# Patient Record
Sex: Female | Born: 1980 | Race: Black or African American | Hispanic: No | Marital: Single | Smoking: Never smoker
Health system: Southern US, Community
[De-identification: ages and names within clinical notes are randomized; demographics above are authoritative.]

## PROBLEM LIST (undated history)

## (undated) DIAGNOSIS — G039 Meningitis, unspecified: Secondary | ICD-10-CM

## (undated) HISTORY — PX: APPENDECTOMY: SHX54

---

## 2016-11-10 ENCOUNTER — Emergency Department (HOSPITAL_COMMUNITY): Payer: Medicaid Other

## 2016-11-10 ENCOUNTER — Encounter (HOSPITAL_COMMUNITY): Payer: Self-pay | Admitting: Emergency Medicine

## 2016-11-10 ENCOUNTER — Emergency Department (HOSPITAL_COMMUNITY)
Admission: EM | Admit: 2016-11-10 | Discharge: 2016-11-10 | Disposition: A | Discharge status: Home / Self Care | Payer: Medicaid Other | Attending: Emergency Medicine | Admitting: Emergency Medicine

## 2016-11-10 DIAGNOSIS — E876 Hypokalemia: Secondary | ICD-10-CM | POA: Diagnosis not present

## 2016-11-10 DIAGNOSIS — R197 Diarrhea, unspecified: Secondary | ICD-10-CM | POA: Diagnosis not present

## 2016-11-10 DIAGNOSIS — R1013 Epigastric pain: Secondary | ICD-10-CM | POA: Diagnosis not present

## 2016-11-10 DIAGNOSIS — R112 Nausea with vomiting, unspecified: Secondary | ICD-10-CM | POA: Insufficient documentation

## 2016-11-10 HISTORY — DX: Meningitis, unspecified: G03.9

## 2016-11-10 LAB — URINALYSIS, ROUTINE W REFLEX MICROSCOPIC
Bacteria, UA: NONE SEEN
Bilirubin Urine: NEGATIVE
Glucose, UA: NEGATIVE mg/dL
Ketones, ur: 20 mg/dL — AB
Leukocytes, UA: NEGATIVE
Nitrite: NEGATIVE
Protein, ur: NEGATIVE mg/dL
Specific Gravity, Urine: 1.029 (ref 1.005–1.030)
Squamous Epithelial / LPF: NONE SEEN
pH: 5 (ref 5.0–8.0)

## 2016-11-10 LAB — CBC
HCT: 35.9 % — ABNORMAL LOW (ref 36.0–46.0)
Hemoglobin: 12.5 g/dL (ref 12.0–15.0)
MCH: 28.3 pg (ref 26.0–34.0)
MCHC: 34.8 g/dL (ref 30.0–36.0)
MCV: 81.2 fL (ref 78.0–100.0)
Platelets: 295 10*3/uL (ref 150–400)
RBC: 4.42 MIL/uL (ref 3.87–5.11)
RDW: 12.9 % (ref 11.5–15.5)
WBC: 12 10*3/uL — ABNORMAL HIGH (ref 4.0–10.5)

## 2016-11-10 LAB — COMPREHENSIVE METABOLIC PANEL
ALT: 13 U/L — ABNORMAL LOW (ref 14–54)
AST: 21 U/L (ref 15–41)
Albumin: 4.8 g/dL (ref 3.5–5.0)
Alkaline Phosphatase: 66 U/L (ref 38–126)
Anion gap: 7 (ref 5–15)
BUN: 12 mg/dL (ref 6–20)
CO2: 25 mmol/L (ref 22–32)
Calcium: 9.9 mg/dL (ref 8.9–10.3)
Chloride: 108 mmol/L (ref 101–111)
Creatinine, Ser: 0.76 mg/dL (ref 0.44–1.00)
GFR calc Af Amer: >60 mL/min (ref >60)
GFR calc non Af Amer: >60 mL/min (ref >60)
Glucose, Bld: 88 mg/dL (ref 65–99)
Potassium: 2.9 mmol/L — ABNORMAL LOW (ref 3.5–5.1)
Sodium: 140 mmol/L (ref 135–145)
Total Bilirubin: 1.2 mg/dL (ref 0.3–1.2)
Total Protein: 7.9 g/dL (ref 6.5–8.1)

## 2016-11-10 LAB — LIPASE, BLOOD: Lipase: 81 U/L — ABNORMAL HIGH (ref 11–51)

## 2016-11-10 LAB — PREGNANCY, URINE: Preg Test, Ur: NEGATIVE

## 2016-11-10 MED ORDER — GI COCKTAIL ~~LOC~~
30.0000 mL | Freq: Once | ORAL | Status: AC
  Administered 2016-11-10: 30 mL via ORAL
  Filled 2016-11-10: qty 30

## 2016-11-10 MED ORDER — OMEPRAZOLE 20 MG PO CPDR: 20.0000 mg | DELAYED_RELEASE_CAPSULE | Freq: Every day | ORAL | 0 refills | Status: AC

## 2016-11-10 MED ORDER — IOPAMIDOL (ISOVUE-300) INJECTION 61%
INTRAVENOUS | Status: AC
  Filled 2016-11-10: qty 100

## 2016-11-10 MED ORDER — MORPHINE SULFATE (PF) 4 MG/ML IV SOLN
4.0000 mg | Freq: Once | INTRAVENOUS | Status: AC
  Administered 2016-11-10: 4 mg via INTRAVENOUS
  Filled 2016-11-10: qty 1

## 2016-11-10 MED ORDER — POTASSIUM CHLORIDE CRYS ER 20 MEQ PO TBCR
40.0000 meq | EXTENDED_RELEASE_TABLET | Freq: Once | ORAL | Status: AC
  Administered 2016-11-10: 40 meq via ORAL
  Filled 2016-11-10: qty 2

## 2016-11-10 MED ORDER — IOPAMIDOL (ISOVUE-300) INJECTION 61%
100.0000 mL | Freq: Once | INTRAVENOUS | Status: AC | PRN
  Administered 2016-11-10: 100 mL via INTRAVENOUS

## 2016-11-10 MED ORDER — ONDANSETRON HCL 4 MG/2ML IJ SOLN
4.0000 mg | Freq: Once | INTRAMUSCULAR | Status: AC
  Administered 2016-11-10: 4 mg via INTRAVENOUS
  Filled 2016-11-10: qty 2

## 2016-11-10 MED ORDER — SUCRALFATE 1 GM/10ML PO SUSP: 1.0000 g | Freq: Three times a day (TID) | ORAL | 0 refills | Status: AC

## 2016-11-10 NOTE — ED Notes (Signed)
Ultrasound AT BEDSIDE 

## 2016-11-10 NOTE — ED Triage Notes (Signed)
Patient c/o N/V with abdominal discomfort x1 week. Denies chest pain and SOB. Ambulatory to triage.

## 2016-11-10 NOTE — ED Notes (Signed)
EDPA ROBERT Provider at bedside. 

## 2016-11-10 NOTE — ED Provider Notes (Signed)
[facility]-EMERGENCY DEPT Provider Note   CSN: [id number] Arrival date & time: 11/10/16  1513     History   Chief Complaint Chief Complaint  Patient presents with  . Emesis  . Nausea    HPI Sonya Carter is a 36 y.o. female.  Patient presents to the ED with a chief complaint of epigastric abdominal pain.  She also complains of associated nausea, vomiting, and a few episodes of diarrhea.  Onset was 1 week ago.  Symptoms are worsened when she sits up.  She denies any change with eating or drinking.  She denies using and alcohol.  She denies any fevers, chills, chest pain, SOB, cough, dysuria, or vaginal discharge.  Prior surgical hx includes appendectomy only.  There are no other associated symptoms or modifying factors.   The history is provided by the patient. No language interpreter was used.    Past Medical History:  Diagnosis Date  . Meningitis     There are no active problems to display for this patient.   Past Surgical History:  Procedure Laterality Date  . APPENDECTOMY      OB History    No data available       Home Medications    Prior to Admission medications   Medication Sig Start Date End Date Taking? Authorizing Provider  diphenhydrAMINE (BENADRYL) 25 MG tablet Take 25 mg by mouth every 6 (six) hours as needed for allergies.   Yes Historical Provider, MD    Family History History reviewed. No pertinent family history.  Social History Social History  Substance Use Topics  . Smoking status: Never Smoker  . Smokeless tobacco: Never Used  . Alcohol use Not on file     Allergies   Patient has no known allergies.   Review of Systems Review of Systems  Gastrointestinal: Positive for abdominal pain, diarrhea, nausea and vomiting.  All other systems reviewed and are negative.    Physical Exam Updated Vital Signs BP 130/88 (BP Location: Left Arm)   Pulse 79   Temp 98.5 F (36.9 C) (Oral)   Resp 16   Ht 5\' 2"  (1.575 m)   LMP 10/13/2016    SpO2 99%   Physical Exam  Constitutional: She is oriented to person, place, and time. She appears well-developed and well-nourished.  HENT:  Head: Normocephalic and atraumatic.  Eyes: Conjunctivae and EOM are normal. Pupils are equal, round, and reactive to light.  Neck: Normal range of motion. Neck supple.  Cardiovascular: Normal rate and regular rhythm.  Exam reveals no gallop and no friction rub.   No murmur heard. Pulmonary/Chest: Effort normal and breath sounds normal. No respiratory distress. She has no wheezes. She has no rales. She exhibits no tenderness.  Abdominal: Soft. Bowel sounds are normal. She exhibits no distension and no mass. There is tenderness. There is no rebound and no guarding.  Moderate epigastric tenderness No other focal abdominal tenderness  Musculoskeletal: Normal range of motion. She exhibits no edema or tenderness.  Neurological: She is alert and oriented to person, place, and time.  Skin: Skin is warm and dry.  Psychiatric: She has a normal mood and affect. Her behavior is normal. Judgment and thought content normal.  Nursing note and vitals reviewed.    ED Treatments / Results  Labs (all labs ordered are listed, but only abnormal results are displayed) Labs Reviewed  LIPASE, BLOOD - Abnormal; Notable for the following:       Result Value   Lipase 81 (*)  All other components within normal limits  COMPREHENSIVE METABOLIC PANEL - Abnormal; Notable for the following:    Potassium 2.9 (*)    ALT 13 (*)    All other components within normal limits  CBC - Abnormal; Notable for the following:    WBC 12.0 (*)    HCT 35.9 (*)    All other components within normal limits  URINALYSIS, ROUTINE W REFLEX MICROSCOPIC  POC URINE PREG, ED    EKG  EKG Interpretation None       Radiology No results found.  Procedures Procedures (including critical care time)  Medications Ordered in ED Medications  morphine 4 MG/ML injection 4 mg (not  administered)  ondansetron (ZOFRAN) injection 4 mg (not administered)  gi cocktail (Maalox,Lidocaine,Donnatal) (not administered)     Initial Impression / Assessment and Plan / ED Course  I have reviewed the triage vital signs and the nursing notes.  Pertinent labs & imaging results that were available during my care of the patient were reviewed by me and considered in my medical decision making (see chart for details).     Patient with epigastric tenderness and some RUQ tenderness.  N/v/d.  Afebrile, VSS.  Will check labs and get RUQ [place].  Will treat pain and reassess.  Lipase noted to be 81.  CT shows not acute process.  I am suspicious of PUD.  Will treat with omeprazole and carafate.    Recommend close follow-up with PCP or return to ED for new or worsening symptoms.  Final Clinical Impressions(s) / ED Diagnoses   Final diagnoses:  Epigastric pain  Nausea vomiting and diarrhea  Hypokalemia    New Prescriptions Discharge Medication List as of 11/10/2016  7:58 PM    START taking these medications   Details  omeprazole (PRILOSEC) 20 MG capsule Take 1 capsule (20 mg total) by mouth daily., Starting Fri 11/10/2016, Print    sucralfate (CARAFATE) 1 GM/10ML suspension Take 10 mLs (1 g total) by mouth 4 (four) times daily -  with meals and at bedtime., Starting Fri 11/10/2016, Print         Roxy Horseman, PA-C 11/10/16 2108    Raeford Razor, MD 11/21/16 [phone]

## 2016-11-10 NOTE — ED Notes (Signed)
Patient transported to CT. PRIOR TO BEDSIDE REPORT WITH ALLEN RN

## 2017-01-28 ENCOUNTER — Emergency Department (HOSPITAL_COMMUNITY)
Admission: EM | Admit: 2017-01-28 | Discharge: 2017-01-28 | Disposition: A | Discharge status: Home / Self Care | Payer: Medicaid Other | Attending: Emergency Medicine | Admitting: Emergency Medicine

## 2017-01-28 ENCOUNTER — Emergency Department (HOSPITAL_COMMUNITY): Payer: Medicaid Other

## 2017-01-28 DIAGNOSIS — R1084 Generalized abdominal pain: Secondary | ICD-10-CM | POA: Insufficient documentation

## 2017-01-28 DIAGNOSIS — R51 Headache: Secondary | ICD-10-CM | POA: Insufficient documentation

## 2017-01-28 DIAGNOSIS — R109 Unspecified abdominal pain: Secondary | ICD-10-CM

## 2017-01-28 LAB — COMPREHENSIVE METABOLIC PANEL
ALT: 12 U/L — AB (ref 14–54)
ANION GAP: 6 (ref 5–15)
AST: 19 U/L (ref 15–41)
Albumin: 4.3 g/dL (ref 3.5–5.0)
Alkaline Phosphatase: 78 U/L (ref 38–126)
BUN: 7 mg/dL (ref 6–20)
CHLORIDE: 107 mmol/L (ref 101–111)
CO2: 27 mmol/L (ref 22–32)
CREATININE: 0.75 mg/dL (ref 0.44–1.00)
Calcium: 9.7 mg/dL (ref 8.9–10.3)
GFR calc Af Amer: >60 mL/min (ref >60)
GFR calc non Af Amer: >60 mL/min (ref >60)
Glucose, Bld: 86 mg/dL (ref 65–99)
Potassium: 3.6 mmol/L (ref 3.5–5.1)
SODIUM: 140 mmol/L (ref 135–145)
Total Bilirubin: 0.8 mg/dL (ref 0.3–1.2)
Total Protein: 7.5 g/dL (ref 6.5–8.1)

## 2017-01-28 LAB — CBC WITH DIFFERENTIAL/PLATELET
Basophils Absolute: 0 10*3/uL (ref 0.0–0.1)
Basophils Relative: 0 %
EOS ABS: 0.2 10*3/uL (ref 0.0–0.7)
EOS PCT: 2 %
HCT: 37.3 % (ref 36.0–46.0)
Hemoglobin: 12.6 g/dL (ref 12.0–15.0)
LYMPHS ABS: 2.6 10*3/uL (ref 0.7–4.0)
Lymphocytes Relative: 30 %
MCH: 27.9 pg (ref 26.0–34.0)
MCHC: 33.8 g/dL (ref 30.0–36.0)
MCV: 82.7 fL (ref 78.0–100.0)
Monocytes Absolute: 0.5 10*3/uL (ref 0.1–1.0)
Monocytes Relative: 5 %
Neutro Abs: 5.4 10*3/uL (ref 1.7–7.7)
Neutrophils Relative %: 63 %
PLATELETS: 324 10*3/uL (ref 150–400)
RBC: 4.51 MIL/uL (ref 3.87–5.11)
RDW: 13.1 % (ref 11.5–15.5)
WBC: 8.6 10*3/uL (ref 4.0–10.5)

## 2017-01-28 LAB — URINALYSIS, ROUTINE W REFLEX MICROSCOPIC
BILIRUBIN URINE: NEGATIVE
Glucose, UA: NEGATIVE mg/dL
HGB URINE DIPSTICK: NEGATIVE
KETONES UR: NEGATIVE mg/dL
Leukocytes, UA: NEGATIVE
Nitrite: NEGATIVE
PROTEIN: NEGATIVE mg/dL
SPECIFIC GRAVITY, URINE: 1.014 (ref 1.005–1.030)
pH: 5 (ref 5.0–8.0)

## 2017-01-28 LAB — I-STAT BETA HCG BLOOD, ED (MC, WL, AP ONLY): I-stat hCG, quantitative: <5 m[IU]/mL (ref <5)

## 2017-01-28 MED ORDER — IBUPROFEN 800 MG PO TABS
800.0000 mg | ORAL_TABLET | Freq: Once | ORAL | Status: AC
  Administered 2017-01-28: 800 mg via ORAL
  Filled 2017-01-28: qty 1

## 2017-01-28 MED ORDER — NAPROXEN 500 MG PO TABS: ORAL_TABLET | ORAL | 0 refills | Status: AC

## 2017-01-28 MED ORDER — TRAMADOL HCL 50 MG PO TABS
50.0000 mg | ORAL_TABLET | Freq: Once | ORAL | Status: DC
  Filled 2017-01-28: qty 1

## 2017-01-28 NOTE — ED Provider Notes (Signed)
[facility]-EMERGENCY DEPT Provider Note   CSN: [id number] Arrival date & time: 01/28/17  1004     History   Chief Complaint Chief Complaint  Patient presents with  . Flank Pain  . Headache    HPI Sonya Carter is a 36 y.o. female.  Patient complains of left flank pain for couple weeks off and on. She also has had mild headache for last 2-3 days.   The history is provided by the patient. No language interpreter was used.  Flank Pain  This is a new problem. The current episode started more than 2 days ago. The problem occurs constantly. The problem has not changed since onset.Associated symptoms include headaches. Pertinent negatives include no chest pain and no abdominal pain. Nothing aggravates the symptoms. Nothing relieves the symptoms. She has tried nothing for the symptoms. The treatment provided no relief.  Headache   This is a new problem. The current episode started 2 days ago. The problem occurs hourly. The problem has been gradually improving. The headache is associated with nothing. The pain is located in the left unilateral region. The quality of the pain is described as dull. The pain is at a severity of 3/10. The pain is mild. The pain does not radiate.    Past Medical History:  Diagnosis Date  . Meningitis     There are no active problems to display for this patient.   Past Surgical History:  Procedure Laterality Date  . APPENDECTOMY      OB History    No data available       Home Medications    Prior to Admission medications   Medication Sig Start Date End Date Taking? Authorizing Provider  diphenhydrAMINE (BENADRYL) 25 MG tablet Take 25 mg by mouth every 6 (six) hours as needed for allergies.    [provider]  naproxen (NAPROSYN) 500 MG tablet Take one every 12 hours as needed for pain 01/28/17   Bethann BerkshireZammit, Skyler Carel, MD  omeprazole (PRILOSEC) 20 MG capsule Take 1 capsule (20 mg total) by mouth daily. 11/10/16   Roxy HorsemanBrowning, Robert, PA-C  sucralfate  (CARAFATE) 1 GM/10ML suspension Take 10 mLs (1 g total) by mouth 4 (four) times daily -  with meals and at bedtime. 11/10/16   Roxy HorsemanBrowning, Robert, PA-C    Family History No family history on file.  Social History Social History  Substance Use Topics  . Smoking status: Never Smoker  . Smokeless tobacco: Never Used  . Alcohol use Not on file     Allergies   Patient has no known allergies.   Review of Systems Review of Systems  Constitutional: Negative for appetite change and fatigue.  HENT: Negative for congestion, ear discharge and sinus pressure.   Eyes: Negative for discharge.  Respiratory: Negative for cough.   Cardiovascular: Negative for chest pain.  Gastrointestinal: Negative for abdominal pain and diarrhea.  Genitourinary: Positive for flank pain. Negative for frequency and hematuria.  Musculoskeletal: Negative for back pain.  Skin: Negative for rash.  Neurological: Positive for headaches. Negative for seizures.  Psychiatric/Behavioral: Negative for hallucinations.     Physical Exam Updated Vital Signs BP 103/73 (BP Location: Right Arm)   Pulse 62   Temp 99.1 F (37.3 C) (Oral)   Resp 18   Ht 5\' 2"  (1.575 m)   Wt 71.8 kg (158 lb 4 oz)   LMP 01/19/2017 (Within Days)   SpO2 100%   BMI 28.94 kg/m   Physical Exam  Constitutional: She is oriented to person,  place, and time. She appears well-developed.  HENT:  Head: Normocephalic.  Eyes: Conjunctivae and EOM are normal. No scleral icterus.  Neck: Neck supple. No thyromegaly present.  Cardiovascular: Normal rate and regular rhythm.  Exam reveals no gallop and no friction rub.   No murmur heard. Pulmonary/Chest: No stridor. She has no wheezes. She has no rales. She exhibits no tenderness.  Abdominal: She exhibits no distension. There is no tenderness. There is no rebound.  Musculoskeletal: Normal range of motion. She exhibits no edema.  Tender left flank  Lymphadenopathy:    She has no cervical adenopathy.    Neurological: She is oriented to person, place, and time. She exhibits normal muscle tone. Coordination normal.  Skin: No rash noted. No erythema.  Psychiatric: She has a normal mood and affect. Her behavior is normal.     ED Treatments / Results  Labs (all labs ordered are listed, but only abnormal results are displayed) Labs Reviewed  URINALYSIS, ROUTINE W REFLEX MICROSCOPIC - Abnormal; Notable for the following:       Result Value   Color, Urine STRAW (*)    All other components within normal limits  COMPREHENSIVE METABOLIC PANEL - Abnormal; Notable for the following:    ALT 12 (*)    All other components within normal limits  CBC WITH DIFFERENTIAL/PLATELET  I-STAT BETA HCG BLOOD, ED ([facility], [facility], [facility] ONLY)    EKG  EKG Interpretation None       Radiology Ct Renal Stone Study  Result Date: 01/28/2017 CLINICAL DATA:  Intermittent left flank pain for several weeks, initial encounter EXAM: CT ABDOMEN AND PELVIS WITHOUT CONTRAST TECHNIQUE: Multidetector CT imaging of the abdomen and pelvis was performed following the standard protocol without IV contrast. COMPARISON:  11/10/2016 FINDINGS: Lower chest: No acute abnormality. Hepatobiliary: No focal liver abnormality is seen. No gallstones, gallbladder wall thickening, or biliary dilatation. Pancreas: Unremarkable. No pancreatic ductal dilatation or surrounding inflammatory changes. Spleen: Normal in size without focal abnormality. Adrenals/Urinary Tract: The adrenal glands are within normal limits. Kidneys are well visualized bilaterally without renal calculi or obstructive change. Stomach/Bowel: The appendix has been surgically removed. No obstructive or inflammatory changes of the bowel are seen. Vascular/Lymphatic: Aortic atherosclerosis. No enlarged abdominal or pelvic lymph nodes. Reproductive: Uterus and bilateral adnexa are unremarkable. Other: No abdominal wall hernia or abnormality. No abdominopelvic ascites. Musculoskeletal: No acute  or significant osseous findings. IMPRESSION: No acute abnormality noted. No significant interval change from the prior exam. Electronically Signed   By: Alcide Clever M.D.   On: 01/28/2017 14:24    Procedures Procedures (including critical care time)  Medications Ordered in ED Medications  traMADol (ULTRAM) tablet 50 mg (not administered)  ibuprofen (ADVIL,MOTRIN) tablet 800 mg (800 mg Oral Given 01/28/17 1224)     Initial Impression / Assessment and Plan / ED Course  I have reviewed the triage vital signs and the nursing notes.  Pertinent labs & imaging results that were available during my care of the patient were reviewed by me and considered in my medical decision making (see chart for details).    Patient with left flank pain. Labs unremarkable. CT scan negative. Suspect muscle pain. Patient will be given Naprosyn and will follow-up with the primary care doctor or GYN doctor   Final Clinical Impressions(s) / ED Diagnoses   Final diagnoses:  Flank pain    New Prescriptions New Prescriptions   NAPROXEN (NAPROSYN) 500 MG TABLET    Take one every 12 hours as needed for  pain     Bethann Berkshire, MD 01/28/17 [id number]

## 2017-01-28 NOTE — Discharge Instructions (Signed)
[facility]. Follow-up with family doctor if continued having headaches

## 2017-01-28 NOTE — ED Triage Notes (Addendum)
Pt c/o intermittent L mid back / flank pain x several weeks but worsening over the past several days. Pt also c/o headache that has started when back pain worsened. Pt states pain worsens with movement. Improves with rest. Pt denies urinary symptoms, denies hematuria. Pt states she has taken Benadryl for headache and no relief.

## 2018-04-22 IMAGING — CT CT RENAL STONE PROTOCOL
2 of 3 series · 16 of 46 positions shown, 18 images · non-contrast
Comparison: 11/10/2016

CLINICAL DATA: Intermittent left flank pain for several weeks,
initial encounter

EXAM:
CT ABDOMEN AND PELVIS WITHOUT CONTRAST
TECHNIQUE: Multidetector CT imaging of the abdomen and pelvis was performed
following the standard protocol without IV contrast.

[Series 3: coronal · coronal · 0.74mm/px · 3 of 129 slices shown]
[im 43/129  soft-tissue]
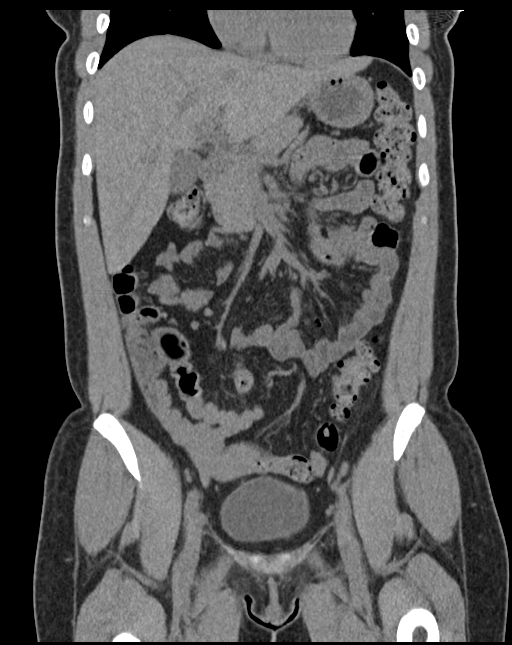
[im 57/129  soft-tissue]
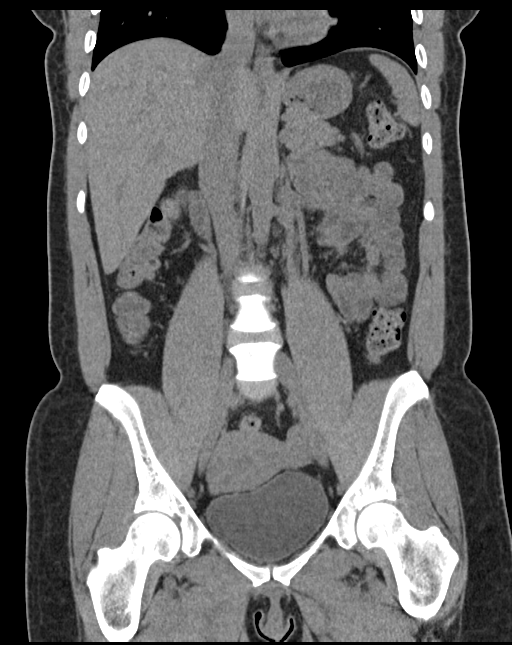
[im 72/129  soft-tissue]
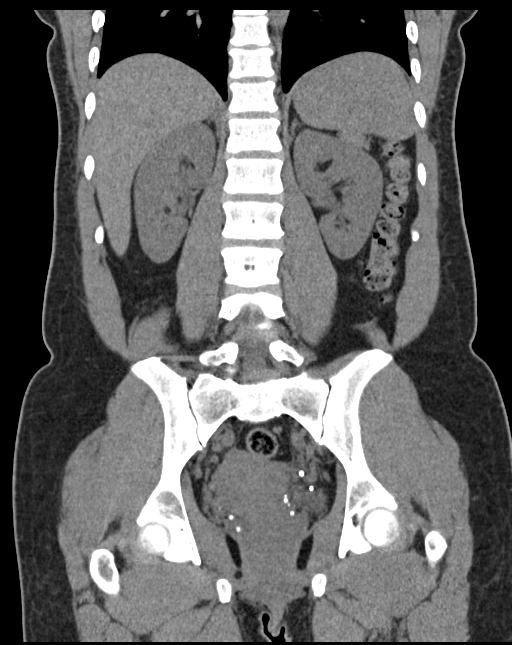

[Series 6: lung · axial · 0.75mm/px · z∈[+337,+415]mm · 13 of 45 slices shown, 15 images]
[im 3/45  soft-tissue]
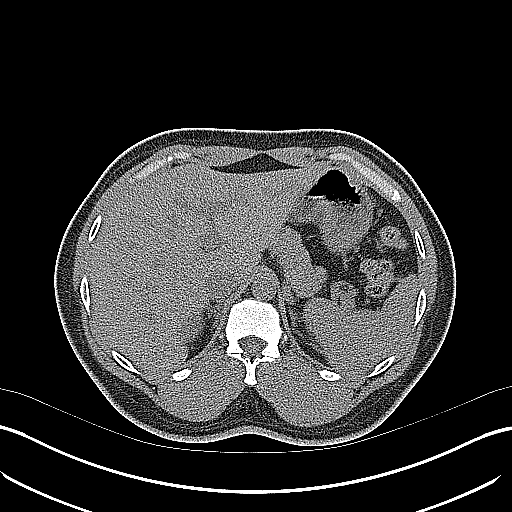
[im 3/45  bone]
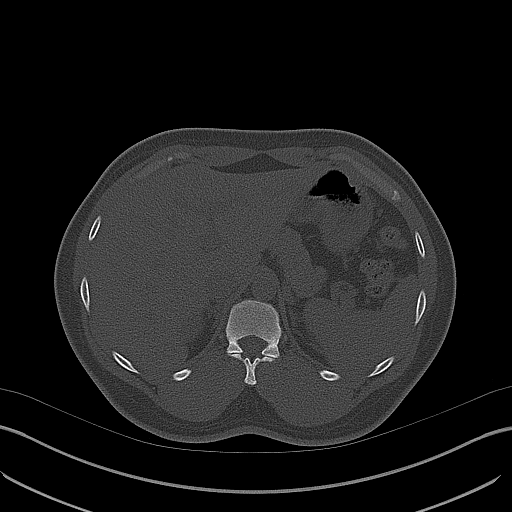
[im 6/45  soft-tissue]
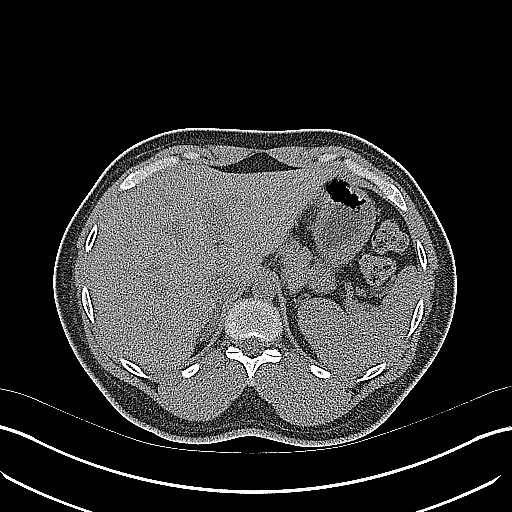
[im 9/45  soft-tissue]
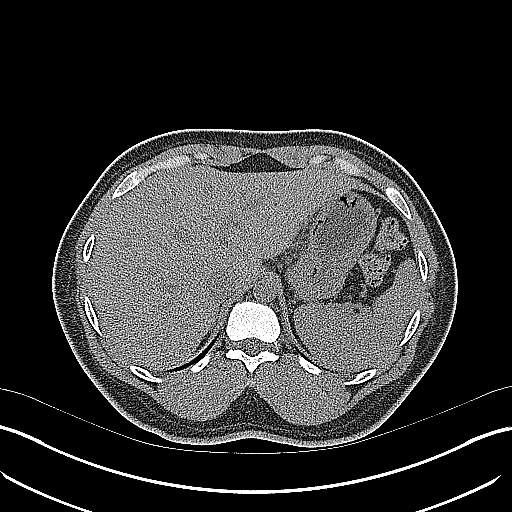
[im 13/45  soft-tissue]
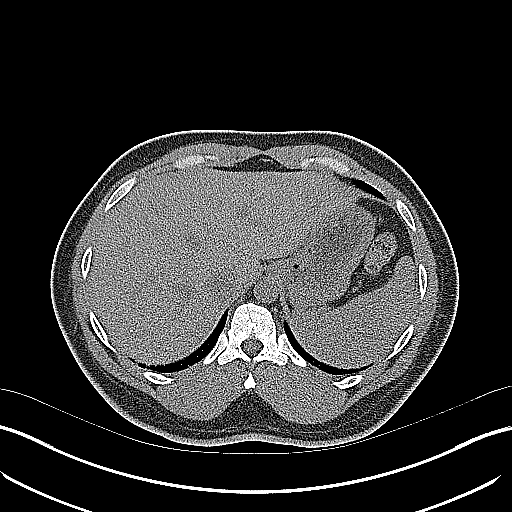
[im 16/45  soft-tissue]
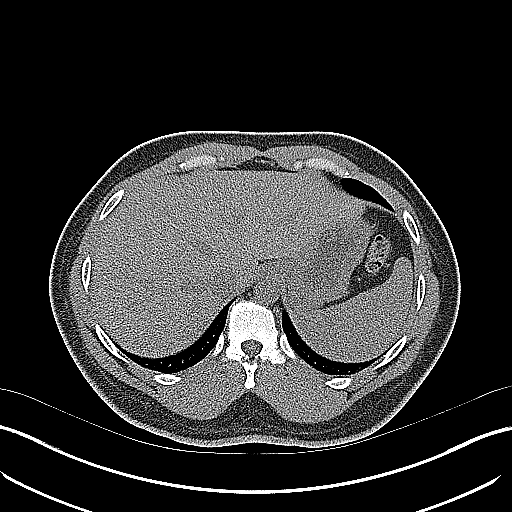
[im 19/45  soft-tissue]
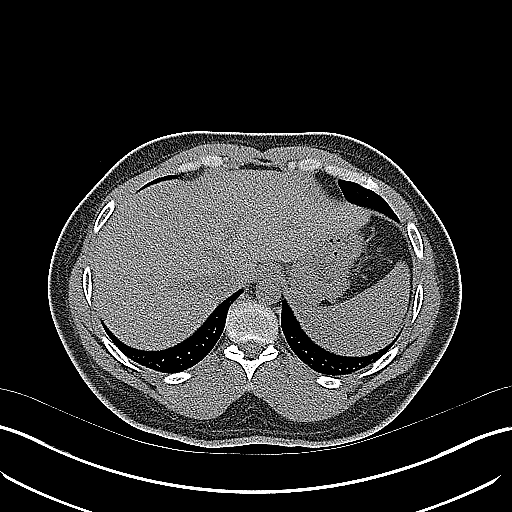
[im 23/45  soft-tissue]
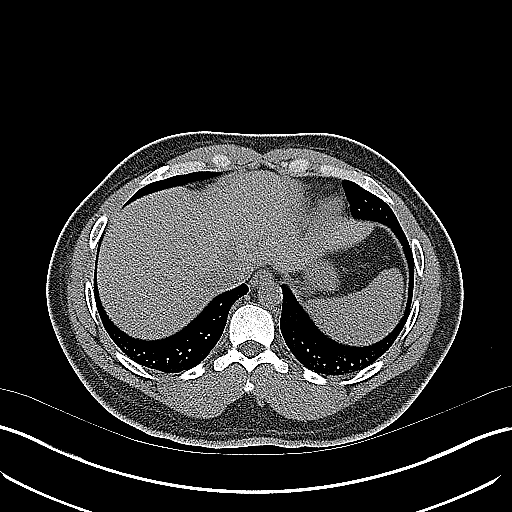
[im 26/45  soft-tissue]
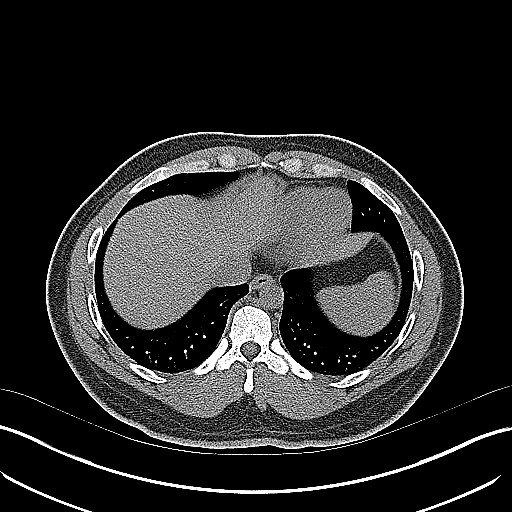
[im 29/45  soft-tissue]
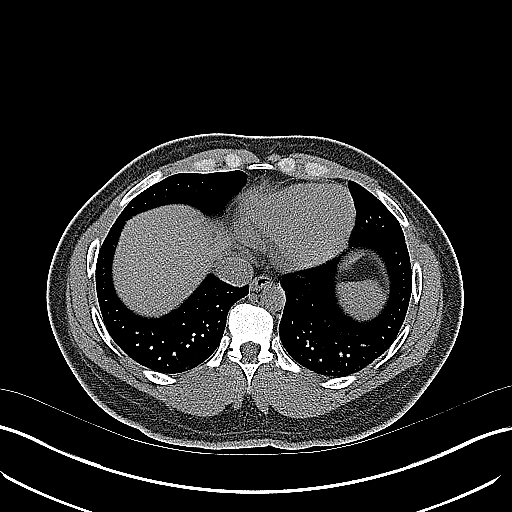
[im 29/45  bone]
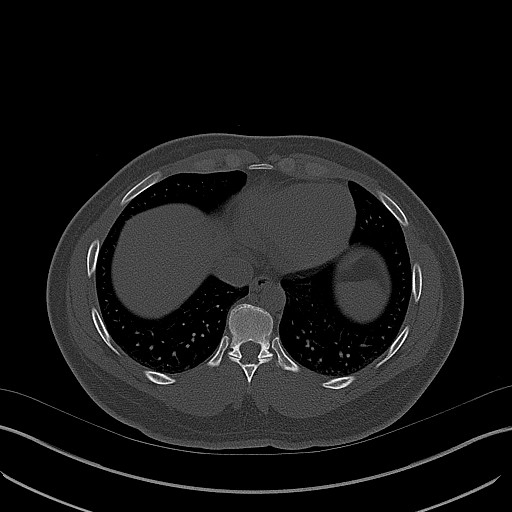
[im 32/45  soft-tissue]
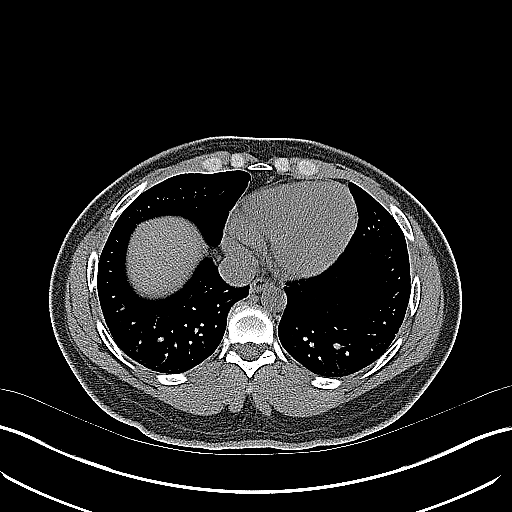
[im 36/45  soft-tissue]
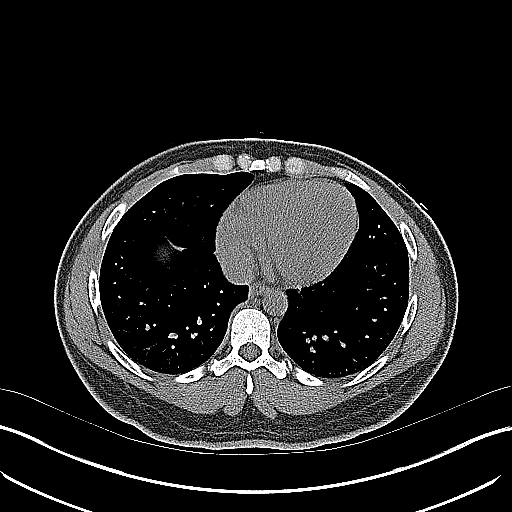
[im 39/45  soft-tissue]
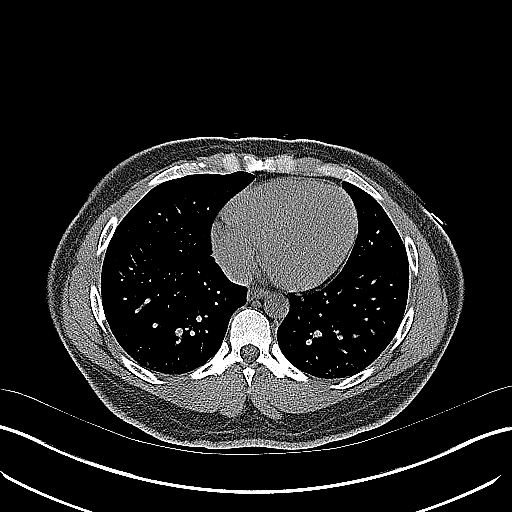
[im 42/45  soft-tissue]
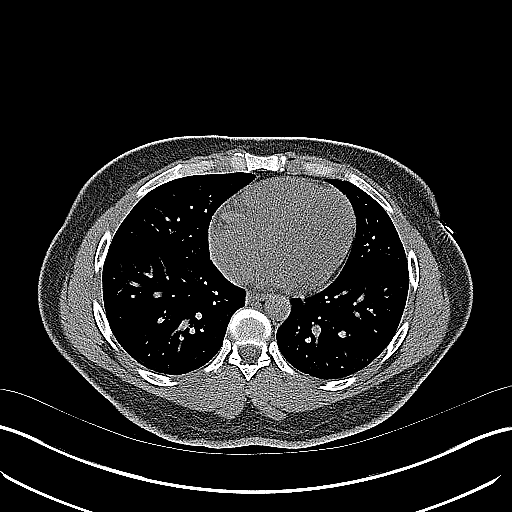

[16 of 46 positions shown; findings below may reference images not displayed]

FINDINGS: Lower chest: No acute abnormality.

Hepatobiliary: No focal liver abnormality is seen. No gallstones,
gallbladder wall thickening, or biliary dilatation.

Pancreas: Unremarkable. No pancreatic ductal dilatation or
surrounding inflammatory changes.

Spleen: Normal in size without focal abnormality.

Adrenals/Urinary Tract: The adrenal glands are within normal limits.
Kidneys are well visualized bilaterally without renal calculi or
obstructive change.

Stomach/Bowel: The appendix has been surgically removed. No
obstructive or inflammatory changes of the bowel are seen.

Vascular/Lymphatic: Aortic atherosclerosis. No enlarged abdominal or
pelvic lymph nodes.

Reproductive: Uterus and bilateral adnexa are unremarkable.

Other: No abdominal wall hernia or abnormality. No abdominopelvic
ascites.

Musculoskeletal: No acute or significant osseous findings.
IMPRESSION: No acute abnormality noted. No significant interval change from the
prior exam.

## 2018-08-12 IMAGING — US US ABDOMEN LIMITED
1 series · 14 of 25 positions shown · non-contrast
Comparison: None.

CLINICAL DATA: Right upper quadrant pain

EXAM:
US ABDOMEN LIMITED - RIGHT UPPER QUADRANT

[Series 1: us abdomen limited · 0.20mm/px · 14 of 94 slices shown]
[im 1/94]
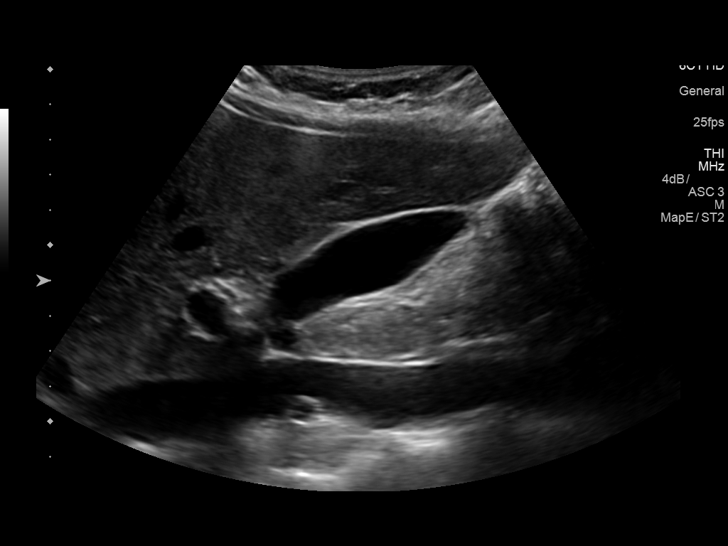
[im 8/94]
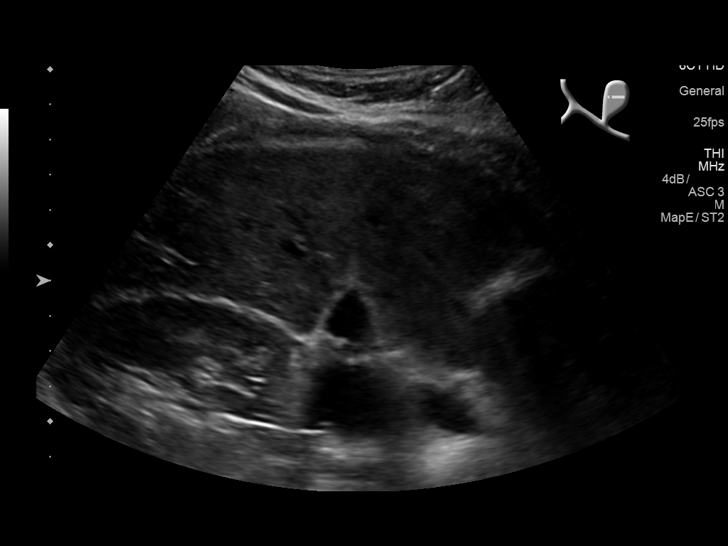
[im 16/94]
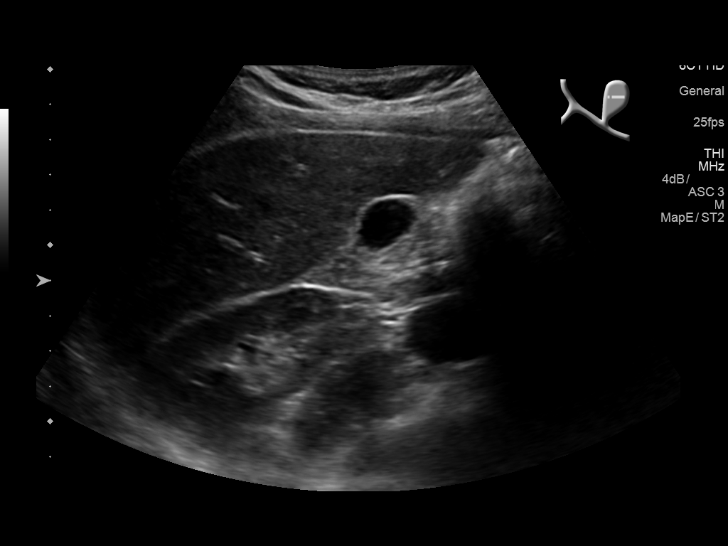
[im 24/94]
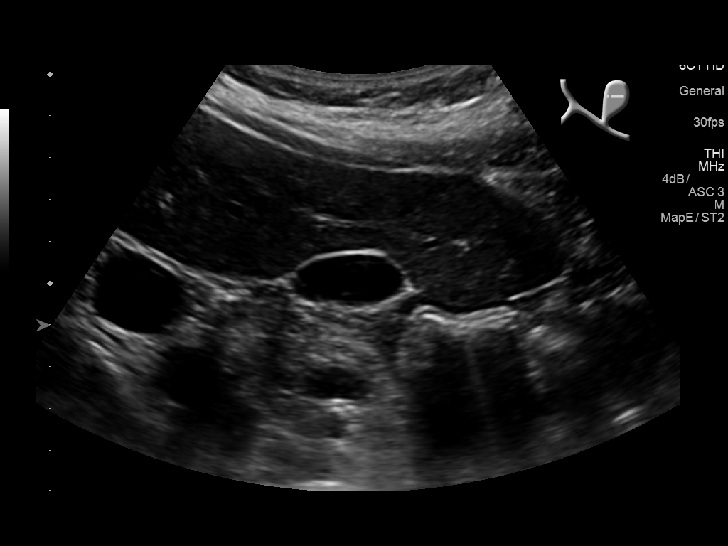
[im 32/94]
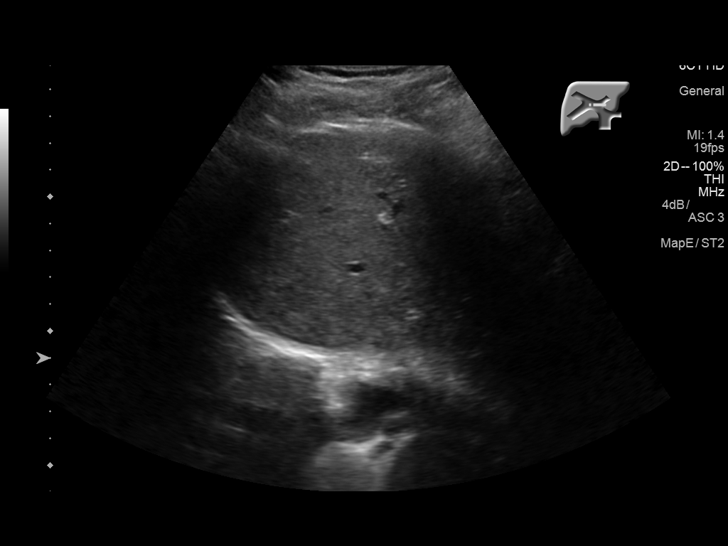
[im 35/94]
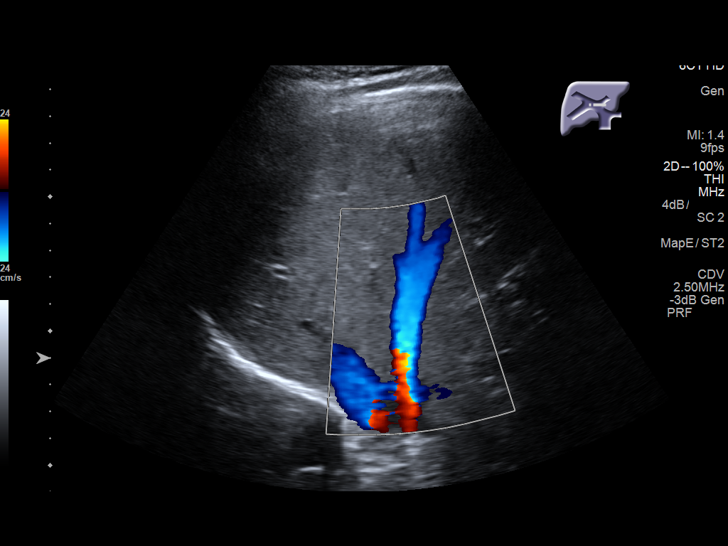
[im 43/94]
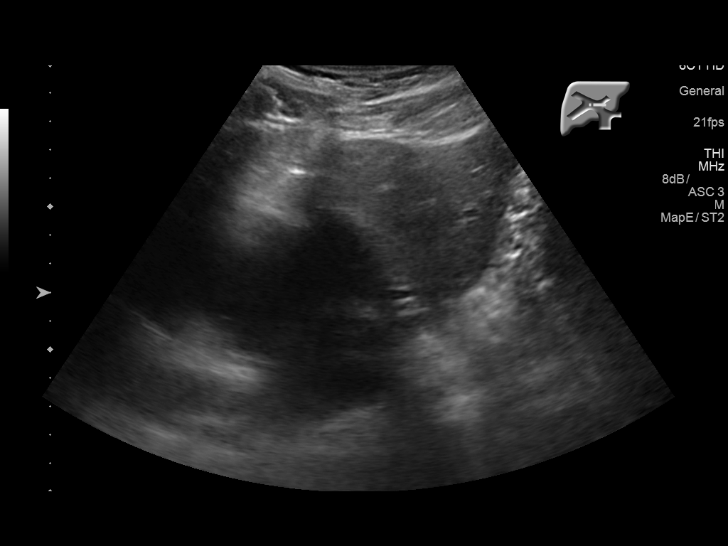
[im 51/94]
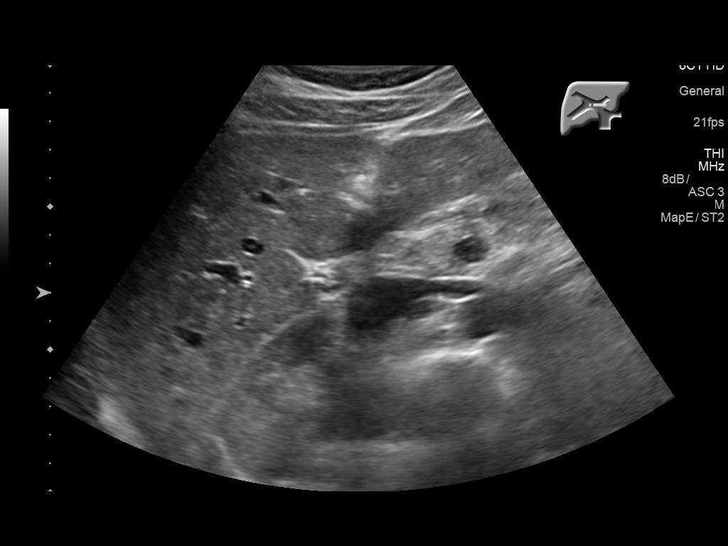
[im 59/94]
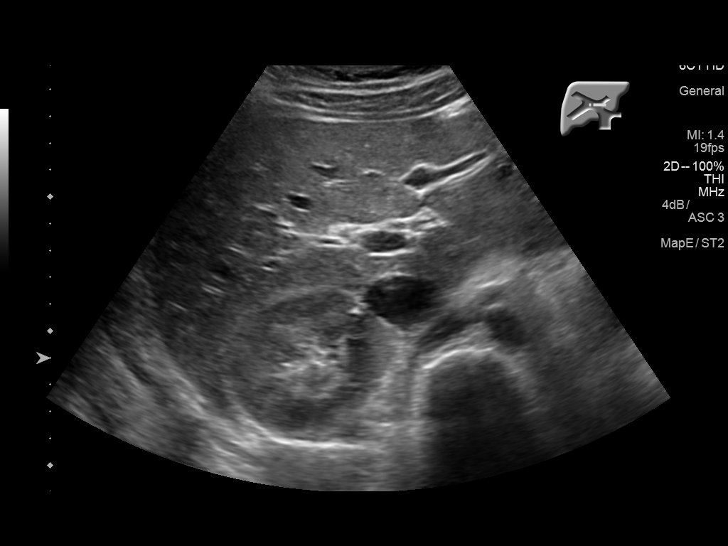
[im 63/94]
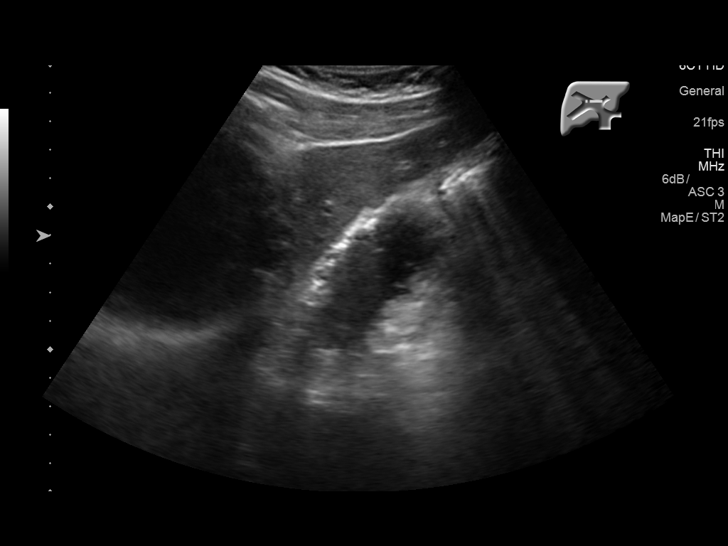
[im 70/94]
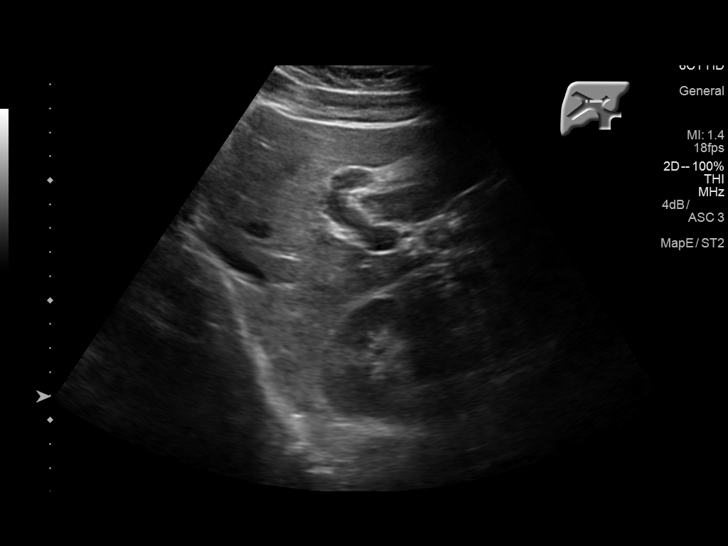
[im 78/94]
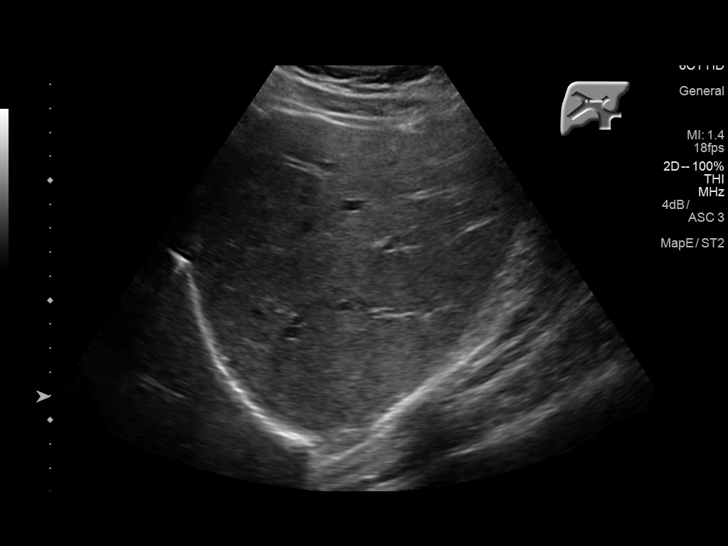
[im 86/94]
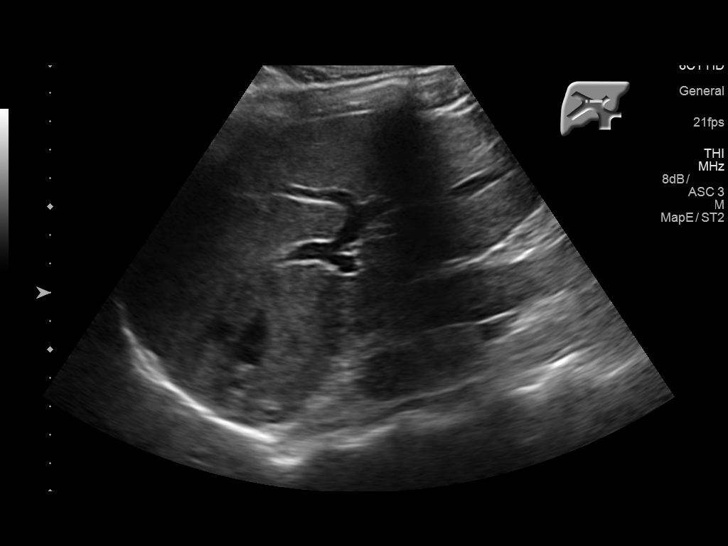
[im 94/94]
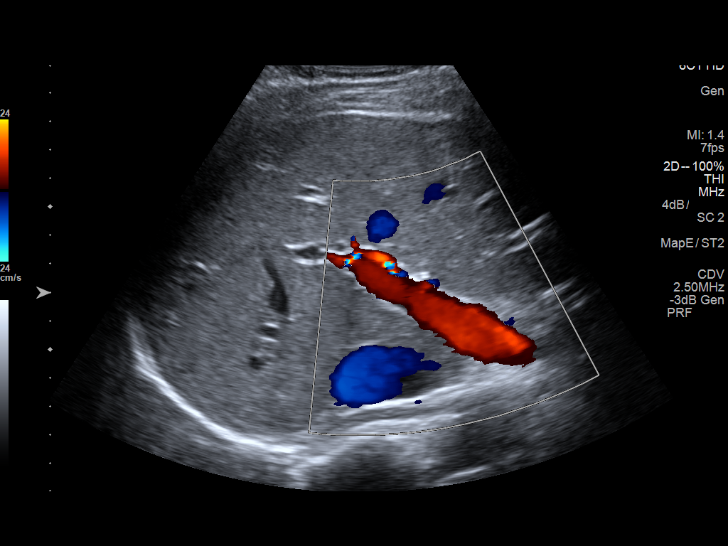

[14 of 25 positions shown; findings below may reference images not displayed]

FINDINGS: Gallbladder:

No gallstones or gallbladder wall thickening. No pericholecystic
fluid. The sonographer reports no sonographic Murphy's sign.

Common bile duct:

Diameter: 1-2 mm

Liver:

No focal lesion identified. Within normal limits in parenchymal
echogenicity.
IMPRESSION: Unremarkable study.  No acute findings.

## 2018-09-28 ENCOUNTER — Encounter (HOSPITAL_COMMUNITY): Payer: Self-pay

## 2018-09-28 ENCOUNTER — Emergency Department (HOSPITAL_COMMUNITY): Payer: Self-pay

## 2018-09-28 ENCOUNTER — Other Ambulatory Visit: Payer: Self-pay

## 2018-09-28 ENCOUNTER — Emergency Department (HOSPITAL_COMMUNITY)
Admission: EM | Admit: 2018-09-28 | Discharge: 2018-09-28 | Disposition: A | Discharge status: Home / Self Care | Payer: Self-pay | Attending: Emergency Medicine | Admitting: Emergency Medicine

## 2018-09-28 DIAGNOSIS — R202 Paresthesia of skin: Secondary | ICD-10-CM | POA: Insufficient documentation

## 2018-09-28 DIAGNOSIS — R519 Headache, unspecified: Secondary | ICD-10-CM

## 2018-09-28 DIAGNOSIS — R51 Headache: Secondary | ICD-10-CM | POA: Insufficient documentation

## 2018-09-28 LAB — POC URINE PREG, ED: Preg Test, Ur: NEGATIVE

## 2018-09-28 MED ORDER — ACETAMINOPHEN 325 MG PO TABS
650.0000 mg | ORAL_TABLET | Freq: Once | ORAL | Status: AC
  Administered 2018-09-28: 650 mg via ORAL
  Filled 2018-09-28: qty 2

## 2018-09-28 MED ORDER — KETOROLAC TROMETHAMINE 30 MG/ML IJ SOLN
30.0000 mg | Freq: Once | INTRAMUSCULAR | Status: AC
  Administered 2018-09-28: 30 mg via INTRAMUSCULAR
  Filled 2018-09-28: qty 1

## 2018-09-28 NOTE — Discharge Instructions (Addendum)
Schedule appointment with neurology to follow-up on your visit today. Return to the emergency department immediately if you develop vision changes, severely worsening headache, facial droop, slurred speech, or new or worsening symptoms.

## 2018-09-28 NOTE — ED Provider Notes (Signed)
[facility]-EMERGENCY DEPT Provider Note   CSN: [id number] Arrival date & time: 09/28/18  1444     History   Chief Complaint Chief Complaint  Patient presents with  . Headache    HPI Sonya Carter is a 38 y.o. female presenting to the emergency department with complaint of 4 days of improving headache.  Patient states she woke up with a headache and had been constant throughout the day the past few days, however is improving and very mild.  She states her headache was not very severe, only aggravating because it persisted.  She treated this once with ibuprofen.  Pain was initially frontal and dull, however today it is on the left side of her head.  She states yesterday she woke up from sleep to vomit, however has not had any nausea or vomiting since.  This morning around 9 AM she experienced tingling sensation to her left face as well as left leg which was new and concerning.  It has been improving until now, however is still present.  She states she does not have very frequent headaches in the past.  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  Denies fevers, neck pain or stiffness, vision changes, photophobia, phonophobia, abdominal pain, numbness or weakness of extremities, facial droop, slurred speech.  History of CVA.  States she recently started smoking Black and milsd a few weeks ago, however does not have a smoking history prior to this.  The history is provided by the patient.    Past Medical History:  Diagnosis Date  . Meningitis     There are no active problems to display for this patient.   Past Surgical History:  Procedure Laterality Date  . APPENDECTOMY       OB History   No obstetric history on file.      Home Medications    Prior to Admission medications   Medication Sig Start Date End Date Taking? Authorizing Provider  naproxen (NAPROSYN) 500 MG tablet Take one every 12 hours as needed for  pain Patient not taking: Reported on 09/28/2018 01/28/17   Bethann BerkshireZammit, Joseph, MD  omeprazole (PRILOSEC) 20 MG capsule Take 1 capsule (20 mg total) by mouth daily. Patient not taking: Reported on 09/28/2018 11/10/16   Roxy HorsemanBrowning, Robert, PA-C  sucralfate (CARAFATE) 1 GM/10ML suspension Take 10 mLs (1 g total) by mouth 4 (four) times daily -  with meals and at bedtime. Patient not taking: Reported on 09/28/2018 11/10/16   Roxy HorsemanBrowning, Robert, PA-C    Family History No family history on file.  Social History Social History   Tobacco Use  . Smoking status: Never Smoker  . Smokeless tobacco: Never Used  Substance Use Topics  . Alcohol use: Yes    Comment: rarely  . Drug use: Yes    Types: Marijuana    Comment: rarely     Allergies   Patient has no known allergies.   Review of Systems Review of Systems  Eyes: Negative for photophobia and visual disturbance.  Gastrointestinal: Positive for vomiting. Negative for abdominal pain and nausea.  Musculoskeletal: Negative for neck pain and neck stiffness.  Neurological: Positive for headaches. Negative for facial asymmetry, speech difficulty, weakness and numbness.  All other systems reviewed and are negative.    Physical Exam Updated Vital Signs BP 115/71 (BP Location: Left Arm)   Pulse 76   Temp 98.1 F (36.7 C) (Oral)   Resp 18   Ht 5'  2" (1.575 m)   Wt 68 kg   LMP 09/14/2018   SpO2 100%   BMI 27.44 kg/m   Physical Exam Vitals signs and nursing note reviewed.  Constitutional:      General: She is not in acute distress.    Appearance: She is well-developed. She is not ill-appearing.  HENT:     Head: Normocephalic and atraumatic.     Right Ear: Tympanic membrane normal.     Left Ear: Tympanic membrane normal.  Eyes:     Conjunctiva/sclera: Conjunctivae normal.  Neck:     Musculoskeletal: Normal range of motion and neck supple. No neck rigidity.  Cardiovascular:     Rate and Rhythm: Normal rate and regular rhythm.     Heart  sounds: Normal heart sounds.  Pulmonary:     Effort: Pulmonary effort is normal.     Breath sounds: Normal breath sounds.  Abdominal:     General: Bowel sounds are normal.     Palpations: Abdomen is soft.     Tenderness: There is no abdominal tenderness.  Skin:    General: Skin is warm.  Neurological:     Mental Status: She is alert and oriented to person, place, and time.     Comments: Mental Status:  Alert, oriented, thought content appropriate, able to give a coherent history. Speech fluent without evidence of aphasia. Able to follow 2 step commands without difficulty.  Cranial Nerves:  II:  Peripheral visual fields grossly normal, pupils equal, round, reactive to light III,IV, VI: ptosis not present, extra-ocular motions intact bilaterally  V,VII: smile symmetric, facial light touch sensation equal VIII: hearing grossly normal to voice  X: uvula elevates symmetrically  XI: bilateral shoulder shrug symmetric and strong XII: midline tongue extension without fassiculations Motor:  Normal tone. 5/5 in upper and lower extremities bilaterally including strong and equal grip strength and dorsiflexion/plantar flexion Sensory: Pt states different sensation to left face (attributes this to tingling) Pinprick and light touch normal in all extremities.  Cerebellar: normal finger-to-nose with bilateral upper extremities Gait: normal gait and balance CV: distal pulses palpable throughout    Psychiatric:        Behavior: Behavior normal.      ED Treatments / Results  Labs (all labs ordered are listed, but only abnormal results are displayed) Labs Reviewed  POC URINE PREG, ED    EKG None  Radiology Ct Head Wo Contrast  Result Date: 09/28/2018 CLINICAL DATA:  Headache and left-sided face tingling. Instance of vomiting. EXAM: CT HEAD WITHOUT CONTRAST TECHNIQUE: Contiguous axial images were obtained from the base of the skull through the vertex without intravenous contrast.  COMPARISON:  None. FINDINGS: Brain: No evidence of acute infarction, hemorrhage, hydrocephalus, extra-axial collection or mass lesion/mass effect. Vascular: No hyperdense vessel or unexpected calcification. Skull: Normal. Negative for fracture or focal lesion. Sinuses/Orbits: No acute finding. Other: None. IMPRESSION: No acute intracranial abnormality. Electronically Signed   By: Ted Mcalpineobrinka  Dimitrova M.D.   On: 09/28/2018 18:57    Procedures Procedures (including critical care time)  Medications Ordered in ED Medications  acetaminophen (TYLENOL) tablet 650 mg (650 mg Oral Given 09/28/18 1816)  ketorolac (TORADOL) 30 MG/ML injection 30 mg (30 mg Intramuscular Given 09/28/18 2026)     Initial Impression / Assessment and Plan / ED Course  I have reviewed the triage vital signs and the nursing notes.  Pertinent labs & imaging results that were available during my care of the patient were reviewed by me and considered in  my medical decision making (see chart for details).  Clinical Course as of Sep 28 2298  Sat Sep 28, 2018  3532 Patient discussed with Dr. Clarene Duke.  Will obtain head CT and treat headache.  Will hold on IV medications at this time as patient drove here w her child.  Plan to reevaluate following imaging and Tylenol.   [JR]  1950 Patient reevaluated.  Reports improvement of symptoms.  She thinks the tingling may be gone, however is unsure.  Will dose with Toradol with a negative CT scan.   [JR]  2041 Patient reevaluated.  Reports resolution in headache and tingling.  States she is ready for discharge.  Will provide neurology referral for follow-up.  Suspect complicated migraine.  Patient discussed with Dr. Clarene Duke, who agrees with care plan for discharge and outpatient follow-up.   [JR]    Clinical Course User Index [JR] Arseniy Toomey, [place] N, PA-C    Pt presenting with mild headache the past few days that is been improving, as well as complaining of tingling sensation to left face  and left leg that began this morning, however improving throughout the day.  Denies history of frequent headaches in the past.  No vision changes, photophobia, phonophobia, fevers, neck pain or stiffness.  On exam patient reports change in sensation to left face where she has the tingling, however no other focal neuro deficits.  She is well-appearing and nondistressed.  No risk factors for stroke or family history of aneurysm.  Patient discussed with Dr. Clarene Duke.  Will provide treatment for headache and obtain CT scan.  CT scan is negative.  Patient reevaluated reports she thinks her symptoms are mostly resolved.  Toradol administered.  On reevaluation she states her symptoms have resolved and is requesting discharge. I believe this is reasonable. Discharge discussed with Dr. Clarene Duke, who is agreeable to plan without outpt follow up.  Low suspicion for TIA or CVA. Unsure of etiology of paresthesia? Suspect complicated headache, symptoms resolved with medication.  Discussed symptomatic management and strict return precautions.  Neurology for referral provided for follow-up.  Patient is agreeable to this plan and without complaints prior to discharge.    Discussed results, findings, treatment and follow up. Patient advised of return precautions. Patient verbalized understanding and agreed with plan.  Final Clinical Impressions(s) / ED Diagnoses   Final diagnoses:  Paresthesia  Acute nonintractable headache, unspecified headache type    ED Discharge Orders    None       Marykathleen Russi, [place] N, PA-C 09/28/18 2301    Little, Ambrose Finland, MD 09/29/18 1521

## 2018-09-28 NOTE — ED Triage Notes (Signed)
Pt states she has had a headache for 3-4 days. Pt states she had 1 episode of emesis last night. Pt endorses tingling down left side of body.

## 2020-12-28 ENCOUNTER — Encounter (HOSPITAL_COMMUNITY): Payer: Self-pay | Admitting: *Deleted

## 2020-12-28 ENCOUNTER — Emergency Department (HOSPITAL_COMMUNITY)
Admission: EM | Admit: 2020-12-28 | Discharge: 2020-12-28 | Disposition: A | Discharge status: Home / Self Care | Payer: Self-pay | Attending: Emergency Medicine | Admitting: Emergency Medicine

## 2020-12-28 DIAGNOSIS — Z9089 Acquired absence of other organs: Secondary | ICD-10-CM | POA: Insufficient documentation

## 2020-12-28 DIAGNOSIS — R1084 Generalized abdominal pain: Secondary | ICD-10-CM | POA: Insufficient documentation

## 2020-12-28 DIAGNOSIS — R110 Nausea: Secondary | ICD-10-CM | POA: Insufficient documentation

## 2020-12-28 LAB — URINALYSIS, ROUTINE W REFLEX MICROSCOPIC
Bilirubin Urine: NEGATIVE
Glucose, UA: NEGATIVE mg/dL
Ketones, ur: NEGATIVE mg/dL
Leukocytes,Ua: NEGATIVE
Nitrite: NEGATIVE
Protein, ur: NEGATIVE mg/dL
Specific Gravity, Urine: 1.016 (ref 1.005–1.030)
pH: 6 (ref 5.0–8.0)

## 2020-12-28 LAB — I-STAT BETA HCG BLOOD, ED (MC, WL, AP ONLY): I-stat hCG, quantitative: <5 m[IU]/mL (ref <5)

## 2020-12-28 LAB — COMPREHENSIVE METABOLIC PANEL
ALT: 12 U/L (ref 0–44)
AST: 20 U/L (ref 15–41)
Albumin: 4.7 g/dL (ref 3.5–5.0)
Alkaline Phosphatase: 80 U/L (ref 38–126)
Anion gap: 8 (ref 5–15)
BUN: 14 mg/dL (ref 6–20)
CO2: 23 mmol/L (ref 22–32)
Calcium: 10.1 mg/dL (ref 8.9–10.3)
Chloride: 109 mmol/L (ref 98–111)
Creatinine, Ser: 0.91 mg/dL (ref 0.44–1.00)
GFR, Estimated: >60 mL/min (ref >60)
Glucose, Bld: 97 mg/dL (ref 70–99)
Potassium: 4.2 mmol/L (ref 3.5–5.1)
Sodium: 140 mmol/L (ref 135–145)
Total Bilirubin: 0.3 mg/dL (ref 0.3–1.2)
Total Protein: 8.5 g/dL — ABNORMAL HIGH (ref 6.5–8.1)

## 2020-12-28 LAB — CBC
HCT: 39.2 % (ref 36.0–46.0)
Hemoglobin: 12.8 g/dL (ref 12.0–15.0)
MCH: 27.2 pg (ref 26.0–34.0)
MCHC: 32.7 g/dL (ref 30.0–36.0)
MCV: 83.4 fL (ref 80.0–100.0)
Platelets: 328 10*3/uL (ref 150–400)
RBC: 4.7 MIL/uL (ref 3.87–5.11)
RDW: 12.9 % (ref 11.5–15.5)
WBC: 10.4 10*3/uL (ref 4.0–10.5)
nRBC: 0 % (ref 0.0–0.2)

## 2020-12-28 LAB — LIPASE, BLOOD: Lipase: 38 U/L (ref 11–51)

## 2020-12-28 MED ORDER — DICYCLOMINE HCL 20 MG PO TABS: 20.0000 mg | ORAL_TABLET | Freq: Two times a day (BID) | ORAL | 0 refills | Status: AC

## 2020-12-28 NOTE — ED Provider Notes (Signed)
[facility]-EMERGENCY DEPT Provider Note   CSN: [id number] Arrival date & time: 12/28/20  1123     History Chief Complaint  Patient presents with  . Abdominal Pain    Sonya Carter is a 40 y.o. female.  HPI Patient presents what her daughter is also being evaluated.  Patient complains of intermittent abdominal pain.  Currently she has no discomfort, but notes that over the past months she has had intermittent pain right-sided, left-sided, inconsistently.  Episodes began and then without clear precipitant, or intervention.  There is no associated vomiting, no diarrhea, there is occasionally nausea.  Patient has a history of prior appendectomy, feels as though her symptoms may have started after that procedure, which is longer than the past few months. She does not see a physician regularly has not seen anyone during this illness.    Past Medical History:  Diagnosis Date  . Meningitis     There are no problems to display for this patient.   Past Surgical History:  Procedure Laterality Date  . APPENDECTOMY       OB History   No obstetric history on file.     No family history on file.  Social History   Tobacco Use  . Smoking status: Never Smoker  . Smokeless tobacco: Never Used  Substance Use Topics  . Alcohol use: Yes    Comment: rarely  . Drug use: Yes    Types: Marijuana    Comment: rarely    Home Medications Prior to Admission medications   Medication Sig Start Date End Date Taking? Authorizing Provider  dicyclomine (BENTYL) 20 MG tablet Take 1 tablet (20 mg total) by mouth 2 (two) times daily. 12/28/20  Yes Gerhard Munch, MD  naproxen (NAPROSYN) 500 MG tablet Take one every 12 hours as needed for pain Patient not taking: Reported on 09/28/2018 01/28/17   Bethann Berkshire, MD  omeprazole (PRILOSEC) 20 MG capsule Take 1 capsule (20 mg total) by mouth daily. Patient not taking: Reported on 09/28/2018 11/10/16   Roxy Horseman, PA-C   sucralfate (CARAFATE) 1 GM/10ML suspension Take 10 mLs (1 g total) by mouth 4 (four) times daily -  with meals and at bedtime. Patient not taking: Reported on 09/28/2018 11/10/16   Roxy Horseman, PA-C    Allergies    Patient has no known allergies.  Review of Systems   Review of Systems  Constitutional:       Per HPI, otherwise negative  HENT:       Per HPI, otherwise negative  Respiratory:       Per HPI, otherwise negative  Cardiovascular:       Per HPI, otherwise negative  Gastrointestinal: Negative for vomiting.  Endocrine:       Negative aside from HPI  Genitourinary:       Neg aside from HPI   Musculoskeletal:       Per HPI, otherwise negative  Skin: Negative.   Neurological: Negative for syncope.    Physical Exam Updated Vital Signs BP 112/81 (BP Location: Left Arm)   Pulse 81   Temp 98.6 F (37 C) (Oral)   Resp 16   Ht 5\' 2"  (1.575 m)   Wt 80.7 kg   LMP 12/03/2020   SpO2 100%   BMI 32.54 kg/m   Physical Exam Vitals and nursing note reviewed.  Constitutional:      General: She is not in acute distress.    Appearance: She is well-developed.  HENT:  Head: Normocephalic and atraumatic.  Eyes:     Conjunctiva/sclera: Conjunctivae normal.  Cardiovascular:     Rate and Rhythm: Normal rate and regular rhythm.  Pulmonary:     Effort: Pulmonary effort is normal. No respiratory distress.     Breath sounds: Normal breath sounds. No stridor.  Abdominal:     General: There is no distension.     Tenderness: There is no abdominal tenderness. There is no guarding.     Comments: Right lower quadrant surgical scar unremarkable  Skin:    General: Skin is warm and dry.  Neurological:     Mental Status: She is alert and oriented to person, place, and time.     Cranial Nerves: No cranial nerve deficit.     ED Results / Procedures / Treatments   Labs (all labs ordered are listed, but only abnormal results are displayed) Labs Reviewed  COMPREHENSIVE  METABOLIC PANEL - Abnormal; Notable for the following components:      Result Value   Total Protein 8.5 (*)    All other components within normal limits  LIPASE, BLOOD  CBC  URINALYSIS, ROUTINE W REFLEX MICROSCOPIC  I-STAT BETA HCG BLOOD, ED ([facility], [facility], [facility] ONLY)    Procedures Procedures   Medications Ordered in ED Medications - No data to display  ED Course  I have reviewed the triage vital signs and the nursing notes.  Pertinent labs & imaging results that were available during my care of the patient were reviewed by me and considered in my medical decision making (see chart for details).  2:50 PM On repeat exam the patient is awake, alert, in no distress, speaking clearly. I reviewed the labs, discussed them with patient.  Labs unremarkable, and with a soft, nontender abdomen, low suspicion for acute abdominal processes.  As the patient is being evaluated for daughter who also has GI illness, there is some consideration for GI processes such as IBS, IBD.  Patient amenable to, appropriate for follow-up with GI clinic for additional evaluation. Final Clinical Impression(s) / ED Diagnoses Final diagnoses:  Generalized abdominal pain    Rx / DC Orders ED Discharge Orders         Ordered    dicyclomine (BENTYL) 20 MG tablet  2 times daily        12/28/20 1438           Gerhard Munch, MD 12/28/20 1451

## 2020-12-28 NOTE — ED Triage Notes (Signed)
Pt complains of right sided abdominal pain, occasionally also on her left, for the past few months. Pain is not constant, she does not have have pain today. No N/V/D

## 2020-12-28 NOTE — Discharge Instructions (Addendum)
As discussed, your evaluation today has been largely reassuring.  But, it is important that you monitor your condition carefully, and do not hesitate to return to the ED if you develop new, or concerning changes in your condition.  Otherwise, please follow-up with your physician for appropriate ongoing care.  During your follow-up visit please discuss how your symptoms have fared while taking the medication you are prescribed today.

## 2020-12-28 NOTE — ED Triage Notes (Signed)
Emergency Medicine Provider Triage Evaluation Note  Sonya Carter , a 40 y.o. female  was evaluated in triage.  Pt complains of abdominal pain.  Patient reports that for the past several months she intermittently has pain in her abdomen is started on the right side of her abdomen and she felt like something was moving and causing an intense squeezing pain, she then intermittently has had some similar pains on the left.  Pain has not been constant and today she has minimal pain.  No associated nausea, vomiting or diarrhea.  No fevers.  No meds prior to arrival.  Review of Systems  Positive: Abdominal pain Negative: Diarrhea, Fevers, nausea, vomiting  Physical Exam  BP (!) 127/92 (BP Location: Right Arm)   Pulse (!) 110   Temp 98.6 F (37 C) (Oral)   Resp 18   Ht 5\' 2"  (1.575 m)   Wt 80.7 kg   LMP 12/03/2020   SpO2 100%   BMI 32.54 kg/m  Gen:   Awake, no distress  HEENT:  Atraumatic  Resp:  Normal effort  Cardiac:  Normal rate  Abd:   Nondistended, nontender  MSK:   Moves extremities without difficulty  Neuro:  Speech clear   Medical Decision Making  Medically screening exam initiated at 12:10 PM.  Appropriate orders placed.  Radhika Steffler was informed that the remainder of the evaluation will be completed by another provider, this initial triage assessment does not replace that evaluation, and the importance of remaining in the ED until their evaluation is complete.  Clinical Impression  1.  Intermittent abdominal pain   02/02/2021, Dartha Lodge 12/28/20 1213

## 2022-06-01 ENCOUNTER — Other Ambulatory Visit: Payer: Self-pay

## 2022-06-01 ENCOUNTER — Emergency Department (HOSPITAL_COMMUNITY)
Admission: EM | Admit: 2022-06-01 | Discharge: 2022-06-02 | Disposition: A | Discharge status: Home / Self Care | Payer: Medicaid Other | Attending: Emergency Medicine | Admitting: Emergency Medicine

## 2022-06-01 ENCOUNTER — Encounter (HOSPITAL_COMMUNITY): Payer: Self-pay | Admitting: Emergency Medicine

## 2022-06-01 DIAGNOSIS — K0889 Other specified disorders of teeth and supporting structures: Secondary | ICD-10-CM | POA: Insufficient documentation

## 2022-06-01 MED ORDER — PENICILLIN V POTASSIUM 250 MG PO TABS
500.0000 mg | ORAL_TABLET | Freq: Once | ORAL | Status: AC
  Administered 2022-06-02: 500 mg via ORAL
  Filled 2022-06-01: qty 2

## 2022-06-01 MED ORDER — IBUPROFEN 800 MG PO TABS
800.0000 mg | ORAL_TABLET | Freq: Once | ORAL | Status: AC
  Administered 2022-06-01: 800 mg via ORAL
  Filled 2022-06-01: qty 1

## 2022-06-01 NOTE — ED Triage Notes (Signed)
Patient reports right upper and lower molar pain onset this week unrelieved by OTC pain medication .

## 2022-06-01 NOTE — ED Provider Notes (Signed)
[facility] EMERGENCY DEPARTMENT Provider Note   CSN: [id number] Arrival date & time: 06/01/22  2326     History  Chief Complaint  Patient presents with   Dental Pain     Sonya Carter is a 41 y.o. female.  The history is provided by the patient and medical records.    41 year old female presenting to the ED with right-sided dental pain.  States has been having trouble for a while but subsided for a few weeks and now has recurred and is much worse.  She reports pain in right upper and lower molars, states it feels like "nerve pain".  She does not currently have a dentist that she sees.  She denies any facial swelling, difficulty swallowing, or fever.  She has been using over-the-counter Anbesol and Tylenol without much relief.  Home Medications Prior to Admission medications   Medication Sig Start Date End Date Taking? Authorizing Provider  ibuprofen (ADVIL) 800 MG tablet Take 1 tablet (800 mg total) by mouth 3 (three) times daily. 06/02/22  Yes Garlon Hatchet, PA-C  penicillin v potassium (VEETID) 500 MG tablet Take 1 tablet (500 mg total) by mouth 4 (four) times daily for 10 days. 06/02/22 06/12/22 Yes Garlon Hatchet, PA-C  dicyclomine (BENTYL) 20 MG tablet Take 1 tablet (20 mg total) by mouth 2 (two) times daily. 12/28/20   Gerhard Munch, MD  naproxen (NAPROSYN) 500 MG tablet Take one every 12 hours as needed for pain Patient not taking: Reported on 09/28/2018 01/28/17   Bethann Berkshire, MD  omeprazole (PRILOSEC) 20 MG capsule Take 1 capsule (20 mg total) by mouth daily. Patient not taking: Reported on 09/28/2018 11/10/16   Roxy Horseman, PA-C  sucralfate (CARAFATE) 1 GM/10ML suspension Take 10 mLs (1 g total) by mouth 4 (four) times daily -  with meals and at bedtime. Patient not taking: Reported on 09/28/2018 11/10/16   Roxy Horseman, PA-C      Allergies    Patient has no known allergies.    Review of Systems   Review of Systems  HENT:  Positive for  dental problem.   All other systems reviewed and are negative.   Physical Exam Updated Vital Signs BP 123/87   Pulse 82   Temp 98.3 F (36.8 C)   Resp 19   SpO2 100%   Physical Exam Vitals and nursing note reviewed.  Constitutional:      Appearance: She is well-developed.  HENT:     Head: Normocephalic and atraumatic.     Mouth/Throat:     Comments: Teeth largely in fair dentition, right upper and lower first molars are intact but appear to have some central decay, surrounding gingiva normal in appearance without abscess formation, handling secretions appropriately, no trismus, no facial or neck swelling, normal phonation without stridor Eyes:     Conjunctiva/sclera: Conjunctivae normal.     Pupils: Pupils are equal, round, and reactive to light.  Cardiovascular:     Rate and Rhythm: Normal rate and regular rhythm.     Heart sounds: Normal heart sounds.  Pulmonary:     Effort: Pulmonary effort is normal.     Breath sounds: Normal breath sounds.  Abdominal:     General: Bowel sounds are normal.     Palpations: Abdomen is soft.  Musculoskeletal:        General: Normal range of motion.     Cervical back: Normal range of motion.  Skin:    General: Skin is warm and dry.  Neurological:     Mental Status: She is alert and oriented to person, place, and time.     ED Results / Procedures / Treatments   Labs (all labs ordered are listed, but only abnormal results are displayed) Labs Reviewed - No data to display  EKG None  Radiology No results found.  Procedures Procedures    Medications Ordered in ED Medications  penicillin v potassium (VEETID) tablet 500 mg (500 mg Oral Given 06/02/22 0001)  ibuprofen (ADVIL) tablet 800 mg (800 mg Oral Given 06/01/22 2359)    ED Course/ Medical Decision Making/ A&P                           Medical Decision Making Risk Prescription drug management.   41 y.o. F here with dental pain.  Ongoing for a while, worse recently.   Afebrile, non-toxic.  No discrete abscess or fluid collection on exam.  No signs/symptoms concerning for ludwig's angina.  Will start abx and refer to dentist on call.  Return here for new concerns.  Final Clinical Impression(s) / ED Diagnoses Final diagnoses:  Pain, dental    Rx / DC Orders ED Discharge Orders          Ordered    ibuprofen (ADVIL) 800 MG tablet  3 times daily        06/02/22 0002    penicillin v potassium (VEETID) 500 MG tablet  4 times daily        06/02/22 0002              Larene Pickett, PA-C 06/02/22 0008    Ripley Fraise, MD 06/02/22 [phone]

## 2022-06-02 MED ORDER — IBUPROFEN 800 MG PO TABS: 800.0000 mg | ORAL_TABLET | Freq: Three times a day (TID) | ORAL | 0 refills | Status: AC

## 2022-06-02 MED ORDER — PENICILLIN V POTASSIUM 500 MG PO TABS: 500.0000 mg | ORAL_TABLET | Freq: Four times a day (QID) | ORAL | 0 refills | Status: AC

## 2022-06-02 NOTE — Discharge Instructions (Signed)
Take the prescribed medication as directed. Follow-up with dentist-- Dr. Radford Pax is on call, also attached list for other local offices.  Call in the morning for appt. Return to the ED for new or worsening symptoms.
